# Patient Record
Sex: Female | Born: 2005 | Race: Black or African American | Hispanic: No | Marital: Single | Smoking: Never smoker
Health system: Southern US, Community
[De-identification: ages and names within clinical notes are randomized; demographics above are authoritative.]

## PROBLEM LIST (undated history)

## (undated) DIAGNOSIS — Z91018 Allergy to other foods: Secondary | ICD-10-CM

## (undated) DIAGNOSIS — L659 Nonscarring hair loss, unspecified: Secondary | ICD-10-CM

## (undated) DIAGNOSIS — Z9109 Other allergy status, other than to drugs and biological substances: Secondary | ICD-10-CM

## (undated) DIAGNOSIS — D84821 Immunodeficiency due to drugs: Secondary | ICD-10-CM

## (undated) DIAGNOSIS — Z7969 Long term (current) use of other immunomodulators and immunosuppressants: Secondary | ICD-10-CM

## (undated) DIAGNOSIS — Z79899 Other long term (current) drug therapy: Secondary | ICD-10-CM

---
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
## 2024-08-11 ENCOUNTER — Other Ambulatory Visit: Payer: Self-pay

## 2024-08-11 ENCOUNTER — Emergency Department (HOSPITAL_BASED_OUTPATIENT_CLINIC_OR_DEPARTMENT_OTHER): Admitting: Radiology

## 2024-08-11 ENCOUNTER — Emergency Department (HOSPITAL_BASED_OUTPATIENT_CLINIC_OR_DEPARTMENT_OTHER)
Admission: EM | Admit: 2024-08-11 | Discharge: 2024-08-11 | Disposition: A | Discharge status: Home / Self Care | Attending: Emergency Medicine | Admitting: Emergency Medicine

## 2024-08-11 DIAGNOSIS — Z91010 Allergy to peanuts: Secondary | ICD-10-CM | POA: Diagnosis not present

## 2024-08-11 DIAGNOSIS — R0789 Other chest pain: Secondary | ICD-10-CM | POA: Insufficient documentation

## 2024-08-11 DIAGNOSIS — M545 Low back pain, unspecified: Secondary | ICD-10-CM | POA: Diagnosis present

## 2024-08-11 DIAGNOSIS — Y92410 Unspecified street and highway as the place of occurrence of the external cause: Secondary | ICD-10-CM | POA: Insufficient documentation

## 2024-08-11 LAB — PREGNANCY, URINE: Preg Test, Ur: NEGATIVE

## 2024-08-11 MED ORDER — CYCLOBENZAPRINE HCL 5 MG PO TABS
5.0000 mg | ORAL_TABLET | Freq: Once | ORAL | Status: AC
  Administered 2024-08-11: 5 mg via ORAL
  Filled 2024-08-11: qty 1

## 2024-08-11 MED ORDER — IBUPROFEN 600 MG PO TABS: 600.0000 mg | ORAL_TABLET | Freq: Four times a day (QID) | ORAL | 0 refills | Status: AC | PRN

## 2024-08-11 MED ORDER — IBUPROFEN 600 MG PO TABS: 600.0000 mg | ORAL_TABLET | Freq: Four times a day (QID) | ORAL | 0 refills | Status: DC | PRN

## 2024-08-11 MED ORDER — IBUPROFEN 800 MG PO TABS
800.0000 mg | ORAL_TABLET | Freq: Once | ORAL | Status: AC
  Administered 2024-08-11: 800 mg via ORAL
  Filled 2024-08-11: qty 1

## 2024-08-11 MED ORDER — CYCLOBENZAPRINE HCL 5 MG PO TABS: 5.0000 mg | ORAL_TABLET | Freq: Two times a day (BID) | ORAL | 0 refills | Status: AC | PRN

## 2024-08-11 MED ORDER — CYCLOBENZAPRINE HCL 5 MG PO TABS
5.0000 mg | ORAL_TABLET | Freq: Two times a day (BID) | ORAL | 0 refills | Status: DC | PRN
Start: 2024-08-11 — End: 2024-08-11
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
